# Patient Record
Sex: Male | Born: 1940 | Race: White | Hispanic: No | Marital: Married | Smoking: Former smoker
Health system: Southern US, Community
[De-identification: ages and names within clinical notes are randomized; demographics above are authoritative.]

## PROBLEM LIST (undated history)

## (undated) DIAGNOSIS — C801 Malignant (primary) neoplasm, unspecified: Secondary | ICD-10-CM

## (undated) DIAGNOSIS — I10 Essential (primary) hypertension: Secondary | ICD-10-CM

## (undated) DIAGNOSIS — E785 Hyperlipidemia, unspecified: Secondary | ICD-10-CM

## (undated) HISTORY — DX: Hyperlipidemia, unspecified: E78.5

## (undated) HISTORY — PX: SKIN CANCER EXCISION: SHX779

---

## 2006-04-14 ENCOUNTER — Ambulatory Visit: Payer: Self-pay | Admitting: Gastroenterology

## 2006-09-26 ENCOUNTER — Emergency Department: Payer: Self-pay | Admitting: General Practice

## 2006-10-10 ENCOUNTER — Emergency Department: Payer: Self-pay | Admitting: General Practice

## 2012-12-01 DIAGNOSIS — I10 Essential (primary) hypertension: Secondary | ICD-10-CM | POA: Insufficient documentation

## 2013-08-03 ENCOUNTER — Ambulatory Visit: Payer: Self-pay | Admitting: Emergency Medicine

## 2015-12-01 ENCOUNTER — Emergency Department
Admission: EM | Admit: 2015-12-01 | Discharge: 2015-12-01 | Disposition: A | Discharge status: Home / Self Care | Payer: Medicare Other | Attending: Student in an Organized Health Care Education/Training Program | Admitting: Student in an Organized Health Care Education/Training Program

## 2015-12-01 ENCOUNTER — Emergency Department: Payer: Medicare Other

## 2015-12-01 ENCOUNTER — Encounter: Payer: Self-pay | Admitting: Emergency Medicine

## 2015-12-01 DIAGNOSIS — M546 Pain in thoracic spine: Secondary | ICD-10-CM | POA: Diagnosis present

## 2015-12-01 DIAGNOSIS — Y999 Unspecified external cause status: Secondary | ICD-10-CM | POA: Diagnosis not present

## 2015-12-01 DIAGNOSIS — S161XXA Strain of muscle, fascia and tendon at neck level, initial encounter: Secondary | ICD-10-CM | POA: Diagnosis not present

## 2015-12-01 DIAGNOSIS — Y939 Activity, unspecified: Secondary | ICD-10-CM | POA: Insufficient documentation

## 2015-12-01 DIAGNOSIS — Y92410 Unspecified street and highway as the place of occurrence of the external cause: Secondary | ICD-10-CM | POA: Insufficient documentation

## 2015-12-01 DIAGNOSIS — I10 Essential (primary) hypertension: Secondary | ICD-10-CM | POA: Insufficient documentation

## 2015-12-01 DIAGNOSIS — R51 Headache: Secondary | ICD-10-CM | POA: Diagnosis not present

## 2015-12-01 DIAGNOSIS — M7918 Myalgia, other site: Secondary | ICD-10-CM

## 2015-12-01 HISTORY — DX: Essential (primary) hypertension: I10

## 2015-12-01 HISTORY — DX: Malignant (primary) neoplasm, unspecified: C80.1

## 2015-12-01 LAB — POCT RAPID STREP A: Streptococcus, Group A Screen (Direct): NEGATIVE

## 2015-12-01 MED ORDER — MELOXICAM 15 MG PO TABS: 15.0000 mg | ORAL_TABLET | Freq: Every day | ORAL | 0 refills | Status: DC

## 2015-12-01 MED ORDER — METHOCARBAMOL 500 MG PO TABS: 500.0000 mg | ORAL_TABLET | Freq: Four times a day (QID) | ORAL | 0 refills | Status: DC | PRN
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
## 2015-12-01 NOTE — ED Notes (Signed)
Discharge instructions reviewed with patient. Patient verbalized understanding. Patient ambulated to lobby without difficulty.   

## 2017-03-03 ENCOUNTER — Ambulatory Visit: Payer: Medicare Other | Admitting: Urology

## 2017-03-03 VITALS — BP 147/87 | HR 71 | Ht 73.0 in | Wt 283.4 lb

## 2017-03-03 DIAGNOSIS — N1339 Other hydronephrosis: Secondary | ICD-10-CM | POA: Diagnosis not present

## 2017-03-06 ENCOUNTER — Encounter: Payer: Self-pay | Admitting: Urology

## 2017-03-06 ENCOUNTER — Ambulatory Visit: Payer: Medicare Other | Admitting: Urology

## 2017-03-06 VITALS — BP 148/83 | HR 86 | Ht 73.0 in | Wt 282.5 lb

## 2017-03-06 DIAGNOSIS — N400 Enlarged prostate without lower urinary tract symptoms: Secondary | ICD-10-CM

## 2017-03-06 NOTE — Progress Notes (Signed)
03/06/2017 8:22 AM   Dickie La 15-Sep-1940 [id number]  Referring provider: Physicians, [facility] [address] [place], [place] [id number]  Chief Complaint  Patient presents with  . Urinary Retention    HPI: The patient is a 76 year old gentleman with past medical history of BPH on Flomax and finasteride presents today for trial of void after developing urinary retention.  These 2 medications were started three days ago.  He has significant baseline obstructive urinary symptoms.  He was seen by my partner 3 days ago for this reason for a trial void was not done due to being a late afternoon appointment.  He presents this morning today for a trial void.  He had a CT performed at [facility] which showed severely distended bladder and bilateral hydroureteronephrosis.  His hydronephrosis has resolved with placement of his catheter.  Apparently had large volume retention greater than 2 L.  Prostate was noted to be 168 cc on renal ultrasound.  Both CT and renal ultrasound are not available to me.   PMH: Past Medical History:  Diagnosis Date  . Cancer ([place])   . Hypertension     Surgical History: No past surgical history on file.  Home Medications:  Allergies as of 03/06/2017   Not on File     Medication List        Accurate as of 03/06/17  8:22 AM. Always use your most recent med list.          amLODipine 5 MG tablet Commonly known as:  NORVASC   atorvastatin 10 MG tablet Commonly known as:  LIPITOR   finasteride 5 MG tablet Commonly known as:  PROSCAR Take 5 mg by mouth.   tamsulosin 0.4 MG Caps capsule Commonly known as:  FLOMAX Take 0.4 mg by mouth.       Allergies: Not on File  Family History: No family history on file.  Social History:  has no tobacco, alcohol, and drug history on file.  ROS:                                        Physical Exam: BP (!) 148/83 (BP Location: Right Arm, Patient Position: Sitting, Cuff Size:  Large)   Pulse 86   Ht 6\' 1"  (1.854 m)   Wt 282 lb 8 oz (128.1 kg)   BMI 37.27 kg/m   Constitutional:  Alert and oriented, No acute distress. HEENT: [place] AT, moist mucus membranes.  Trachea midline, no masses. Cardiovascular: No clubbing, cyanosis, or edema. Respiratory: Normal respiratory effort, no increased work of breathing. GI: Abdomen is soft, nontender, nondistended, no abdominal masses GU: No CVA tenderness.  Skin: No rashes, bruises or suspicious lesions. Lymph: No cervical or inguinal adenopathy. Neurologic: Grossly intact, no focal deficits, moving all 4 extremities. Psychiatric: Normal mood and affect.  Laboratory Data: No results found for: WBC, HGB, HCT, MCV, PLT  No results found for: CREATININE  No results found for: PSA  No results found for: TESTOSTERONE  No results found for: HGBA1C  Urinalysis No results found for: COLORURINE, APPEARANCEUR, LABSPEC, PHURINE, GLUCOSEU, HGBUR, BILIRUBINUR, KETONESUR, PROTEINUR, UROBILINOGEN, NITRITE, LEUKOCYTESUR  Pertinent Imaging: CT and renal ultrasound reports reviewed.  Assessment & Plan:    1.  BPH with urinary retention We will have the patient undergo a trial of void today.  I did share with him that his prostate is 168 g and a  high volume urinary retention so it is unlikely he will pass this test.  For now we will keep him on his Flomax and finasteride.  If he fails, we can give him another trial of void in a week or 2.  If this is unsuccessful, refer him to my partner Dr. Erlene Quan to discuss HoLEP versus simple prostatectomy.   No Follow-up on file.  Nickie Retort, MD  [facility] [address], [place], [place] [address] [phone]

## 2017-03-06 NOTE — Progress Notes (Signed)
Fill and Pull Catheter Removal  Patient is present today for a catheter removal.  Patient was cleaned and prepped in a sterile fashion 347ml of sterile water/ saline was instilled into the bladder when the patient felt the urge to urinate. 72ml of water was then drained from the balloon.  A 16 coude foley cath was removed from the bladder no complications were noted .  Patient as then given some time to void on their own.  Patient can not void  on their own after some time.  Patient tolerated well.  Preformed by: Elberta Leatherwood, CMA  Follow up/ Additional notes:

## 2017-03-07 NOTE — Progress Notes (Signed)
Simple Catheter Placement  Due to urinary retention patient is present today for a foley cath placement.  Patient was cleaned and prepped in a sterile fashion with betadine and lidocaine jelly 2% was instilled into the urethra.  A 16FR coude foley catheter was inserted, urine return was noted  1010ml, urine was yellow in color.  The balloon was filled with 10cc of sterile water.  A leg bag was attached for drainage. Patient was also given a night bag to take home and was given instruction on how to change from one bag to another.  Patient was given instruction on proper catheter care.  Patient tolerated well.  Preformed by: Toniann Fail, LPN

## 2017-03-09 NOTE — Progress Notes (Signed)
03/03/2017 9:49 PM   Danny Douglas 24-Nov-1940 [id number]  Referring provider: Physicians, [facility] [address] [place], [place] [address]  Chief Complaint  Patient presents with  . Urinary Retention    HPI: Jewelz Kobus is a 76 year old male who presents today for evaluation of urinary retention.  He was seen at [facility] on 02/12/2017 with a 2-3-week history of urinary frequency, voiding small amounts, decreased force and caliber of his urinary stream and nocturia x4-5.  Denied fever, chills or gross hematuria.   Urinalysis was unremarkable.  He was treated with an empiric course of Septra DS for prostatitis.  He called back on 10/20 c/o persistent symptoms and dysuria and he was switched to Cipro. He called back on 10/22 complaining of persistent symptoms and decreased energy.  His creatinine was elevated in the 4 range.  A CT was performed which showed significant bladder distention and bilateral hydronephrosis/hydroureter.  He was advised to go to the emergency department and was seen there on 10/25.  A Foley catheter was placed with over 2 L of urine obtained.  He was discharged with an indwelling Foley catheter and started on tamsulosin and finasteride.  He has a catheter on 11/20 which was the earliest he can get at [facility] and presents.  Here today for further evaluation  PMH: Past Medical History:  Diagnosis Date  . Cancer ([place])   . Hyperlipidemia   . Hypertension     Surgical History: Past Surgical History:  Procedure Laterality Date  . SKIN CANCER EXCISION      Home Medications:  Allergies as of 03/03/2017   Not on File     Medication List        Accurate as of 03/03/17 11:59 PM. Always use your most recent med list.          amLODipine 5 MG tablet Commonly known as:  NORVASC   atorvastatin 10 MG tablet Commonly known as:  LIPITOR   finasteride 5 MG tablet Commonly known as:  PROSCAR Take 5 mg by mouth.   tamsulosin 0.4 MG Caps  capsule Commonly known as:  FLOMAX Take 0.4 mg by mouth.       Allergies: Not on File  Family History: Family History  Problem Relation Age of Onset  . Prostate cancer Neg Hx   . Bladder Cancer Neg Hx   . Kidney cancer Neg Hx     Social History:  reports that he has quit smoking. he has never used smokeless tobacco. He reports that he does not drink alcohol or use drugs.  ROS: UROLOGY Frequent Urination?: Yes Hard to postpone urination?: Yes Burning/pain with urination?: No Get up at night to urinate?: Yes Leakage of urine?: Yes Urine stream starts and stops?: No Trouble starting stream?: Yes Do you have to strain to urinate?: No Blood in urine?: Yes Urinary tract infection?: No Sexually transmitted disease?: No Injury to kidneys or bladder?: No Painful intercourse?: No Weak stream?: No Erection problems?: No Penile pain?: No  Gastrointestinal Nausea?: No Vomiting?: No Indigestion/heartburn?: No Diarrhea?: No Constipation?: No  Constitutional Fever: No Night sweats?: No Weight loss?: No Fatigue?: No  Skin Skin rash/lesions?: Yes Itching?: Yes  Eyes Blurred vision?: No Double vision?: No  Ears/Nose/Throat Sore throat?: No Sinus problems?: No  Hematologic/Lymphatic Swollen glands?: No Easy bruising?: No  Cardiovascular Leg swelling?: Yes Chest pain?: No  Respiratory Cough?: No Shortness of breath?: No  Endocrine Excessive thirst?: No  Musculoskeletal Back pain?: No Joint pain?:  No  Neurological Headaches?: No Dizziness?: No  Psychologic Depression?: No Anxiety?: No  Physical Exam: BP (!) 147/87 (BP Location: Right Arm, Patient Position: Sitting, Cuff Size: Large)   Pulse 71   Ht 6\' 1"  (1.854 m)   Wt 283 lb 6.4 oz (128.5 kg)   BMI 37.39 kg/m   Constitutional:  Alert and oriented, No acute distress. HEENT: [place] AT, moist mucus membranes.  Trachea midline, no masses. Cardiovascular: No clubbing, cyanosis, or  edema. Respiratory: Normal respiratory effort, no increased work of breathing. GI: Abdomen is soft, nontender, nondistended, no abdominal masses GU: No CVA tenderness. Prostate 60+ cc, smooth without nodules. Skin: No rashes, bruises or suspicious lesions. Lymph: No cervical or inguinal adenopathy. Neurologic: Grossly intact, no focal deficits, moving all 4 extremities. Psychiatric: Normal mood and affect.   Pertinent Imaging: CT was personally reviewed.   Assessment & Plan:    76 year old male with high volume urinary retention and bilateral hydronephrosis with acute renal failure.  He has been on tamsulosin approximately 10 days.  Due to the time of his appointment I did not recommend a voiding trial today and a follow-up renal ultrasound is scheduled.  He will return for an early morning appointment for review of U/S, catheter removal and trial of voiding.  - Ultrasound renal complete; Future    Abbie Sons, [place] [address] [place], [place] [address] [phone]

## 2017-03-25 ENCOUNTER — Ambulatory Visit: Payer: Medicare Other | Admitting: Urology

## 2019-04-30 DIAGNOSIS — G4733 Obstructive sleep apnea (adult) (pediatric): Secondary | ICD-10-CM | POA: Insufficient documentation

## 2019-05-17 DIAGNOSIS — E669 Obesity, unspecified: Secondary | ICD-10-CM | POA: Insufficient documentation

## 2019-06-30 DIAGNOSIS — G20C Parkinsonism, unspecified: Secondary | ICD-10-CM | POA: Insufficient documentation

## 2019-07-09 ENCOUNTER — Other Ambulatory Visit: Payer: Self-pay | Admitting: Neurology

## 2019-07-09 ENCOUNTER — Other Ambulatory Visit (HOSPITAL_COMMUNITY): Payer: Self-pay | Admitting: Neurology

## 2019-07-09 DIAGNOSIS — R2689 Other abnormalities of gait and mobility: Secondary | ICD-10-CM

## 2019-07-21 ENCOUNTER — Ambulatory Visit
Admission: RE | Admit: 2019-07-21 | Discharge: 2019-07-21 | Disposition: A | Discharge status: Home / Self Care | Payer: Medicare Other | Source: Ambulatory Visit | Attending: Neurology | Admitting: Neurology

## 2019-07-21 ENCOUNTER — Other Ambulatory Visit: Payer: Self-pay

## 2019-07-21 DIAGNOSIS — R2689 Other abnormalities of gait and mobility: Secondary | ICD-10-CM | POA: Diagnosis present

## 2021-09-10 IMAGING — MR MR HEAD W/O CM
12 series · 44 of 48 positions shown · non-contrast
Comparison: None.

CLINICAL DATA: Imbalance, left hand tremor

EXAM:
MRI HEAD WITHOUT CONTRAST
TECHNIQUE: Multiplanar, multiecho pulse sequences of the brain and surrounding
structures were obtained without intravenous contrast.

[Series 5: ax dwi_tracew · axial · 3.0mm · 0.60mm/px · z∈[-54,+97]mm · 4 of 48 slices shown]
[im 1/48]
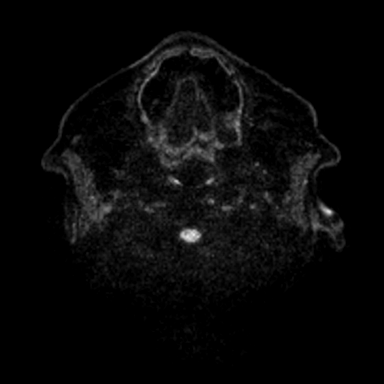
[im 16/48]
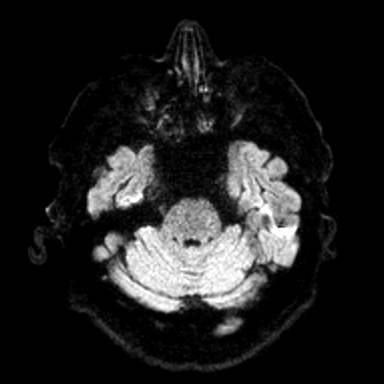
[im 32/48]
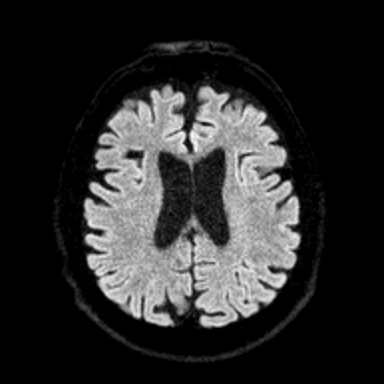
[im 48/48]
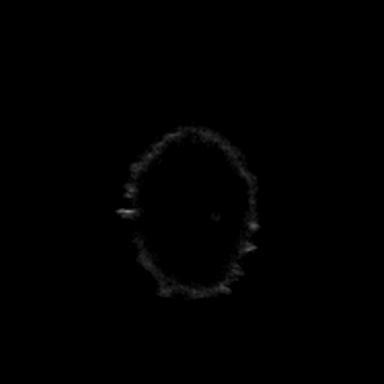

[Series 6: ax dwi_adc · axial · 3.0mm · 0.60mm/px · z∈[-54,+97]mm · 3 of 48 slices shown]
[im 1/48]
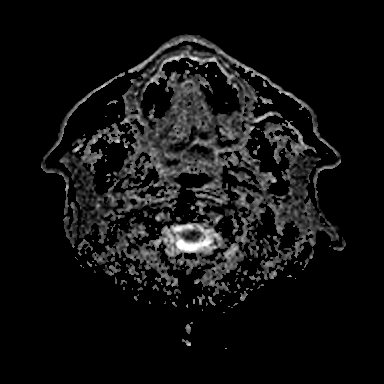
[im 24/48]
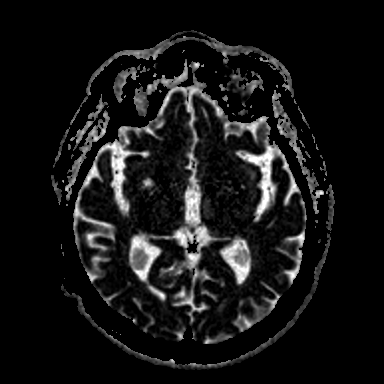
[im 48/48]
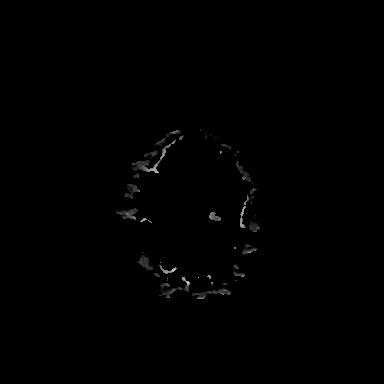

[Series 7: cor dwi_tracew · coronal · 5.0mm · 0.60mm/px · 5 of 68 slices shown]
[im 1/68]
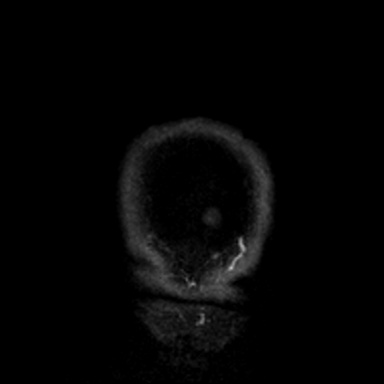
[im 17/68]
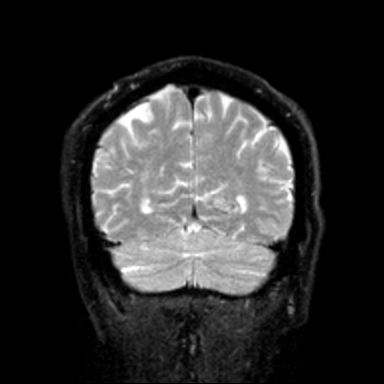
[im 34/68]
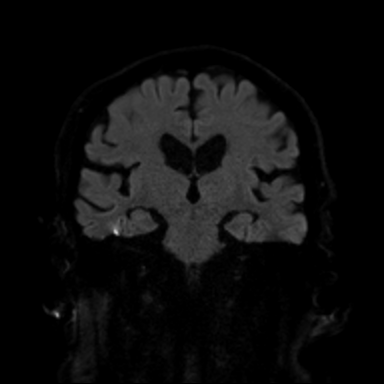
[im 51/68]
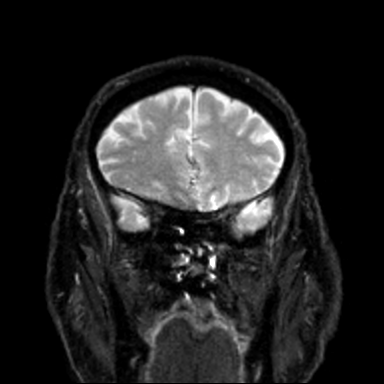
[im 68/68]
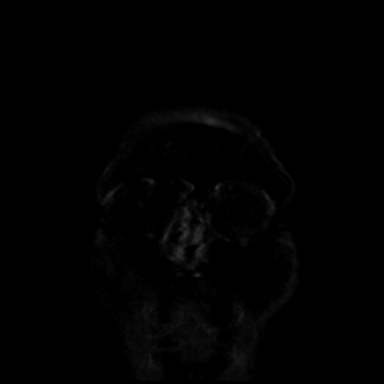

[Series 8: cor dwi_adc · coronal · 5.0mm · 0.60mm/px · 2 of 34 slices shown]
[im 1/34]
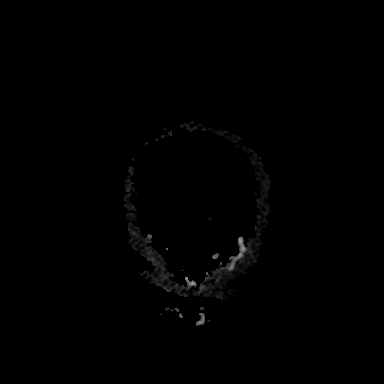
[im 34/34]
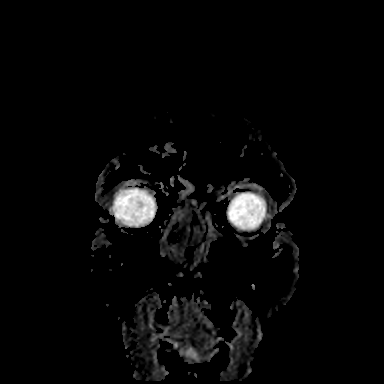

[Series 9: T1 · sagittal · 5.0mm · 0.62mm/px · 2 of 23 slices shown (1 of 2)]
[im 1/23]
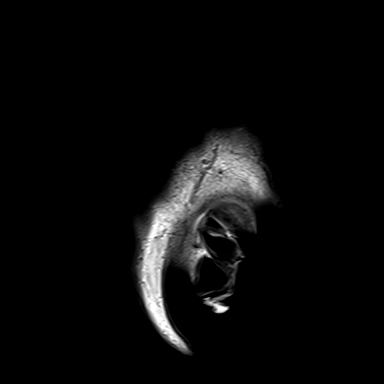
[im 23/23]
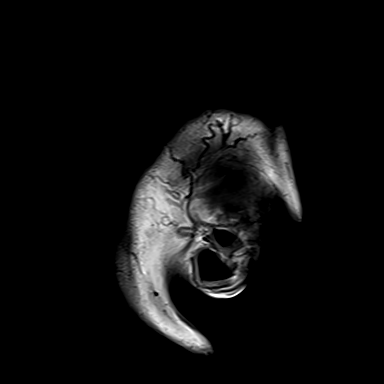

[Series 10: T2 · axial · 5.0mm · 0.53mm/px · z∈[-50,+90]mm · 2 of 25 slices shown (1 of 2)]
[im 1/25]
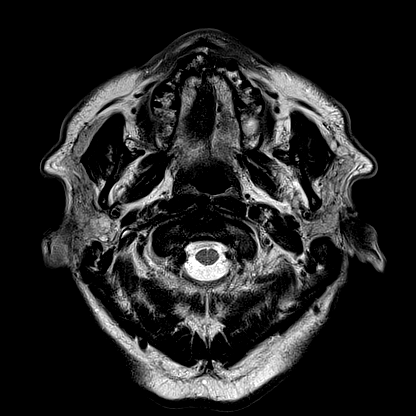
[im 25/25]
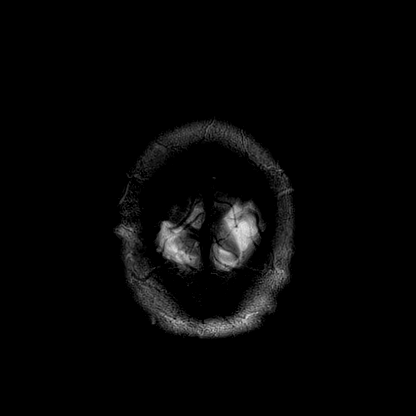

[Series 11: mag_images · axial · 3.0mm · 0.90mm/px · z∈[-65,+108]mm · 4 of 60 slices shown]
[im 1/60]
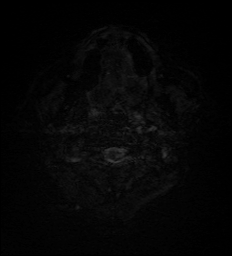
[im 20/60]
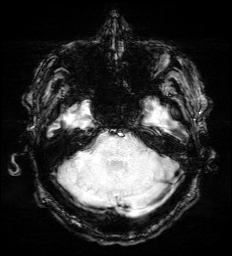
[im 40/60]
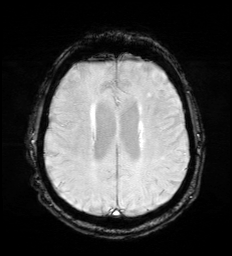
[im 60/60]
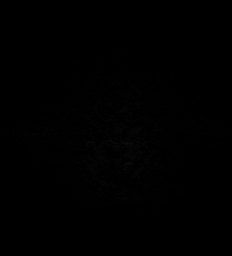

[Series 12: pha_images · axial · 3.0mm · 0.90mm/px · z∈[-65,+108]mm · 4 of 60 slices shown]
[im 1/60]
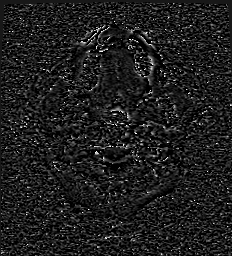
[im 20/60]
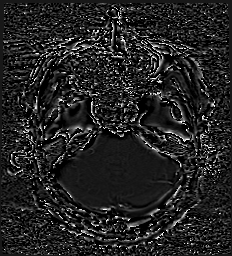
[im 40/60]
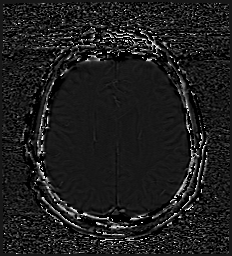
[im 60/60]
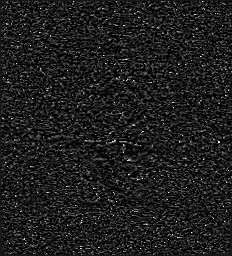

[Series 13: swi_images · axial · 3.0mm · 0.90mm/px · z∈[-65,+108]mm · 4 of 60 slices shown]
[im 1/60]
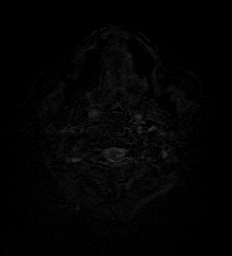
[im 20/60]
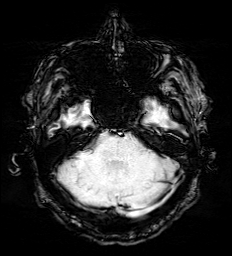
[im 40/60]
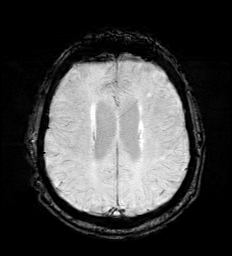
[im 60/60]
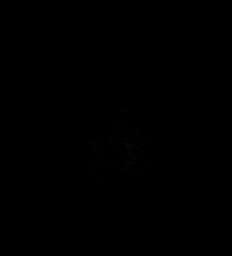

[Series 15: FLAIR · axial · 3.0mm · 0.53mm/px · z∈[-59,+99]mm · 4 of 55 slices shown]
[im 1/55]
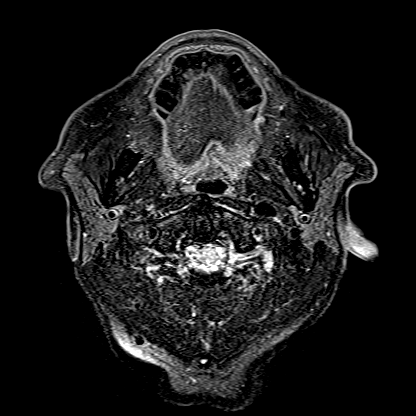
[im 19/55]
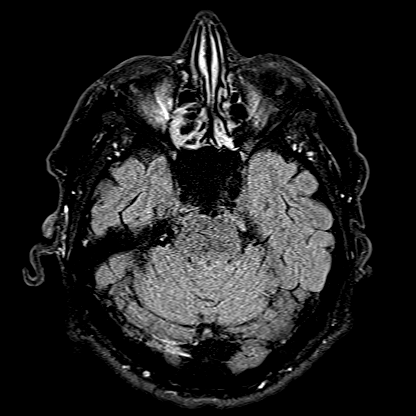
[im 37/55]
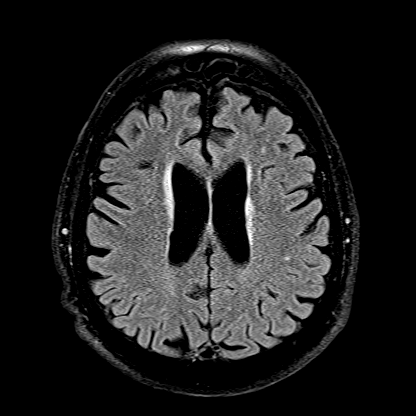
[im 55/55]
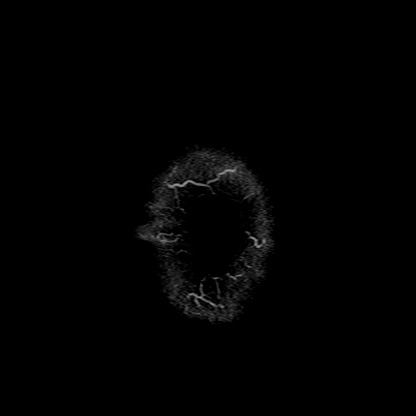

[Series 16: T1 · axial · 1.0mm · 0.98mm/px · z∈[-54,+101]mm · 8 of 160 slices shown (2 of 2)]
[im 1/160]
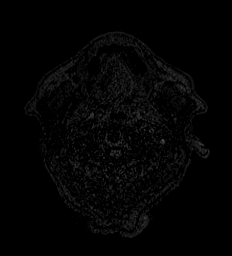
[im 29/160]
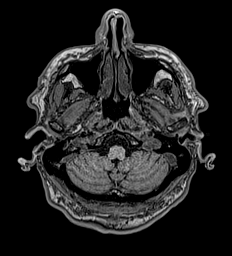
[im 44/160]
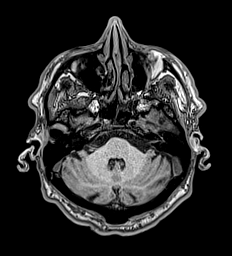
[im 73/160]
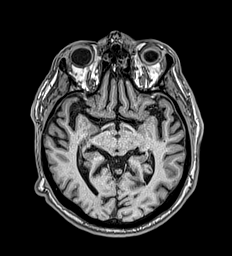
[im 87/160]
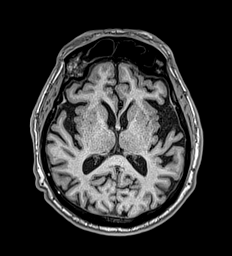
[im 116/160]
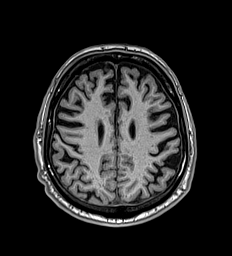
[im 131/160]
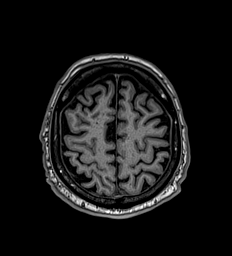
[im 160/160]
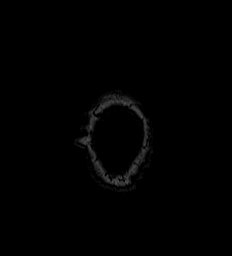

[Series 17: T2 · coronal · 5.0mm · 0.57mm/px · 2 of 26 slices shown (2 of 2)]
[im 1/26]
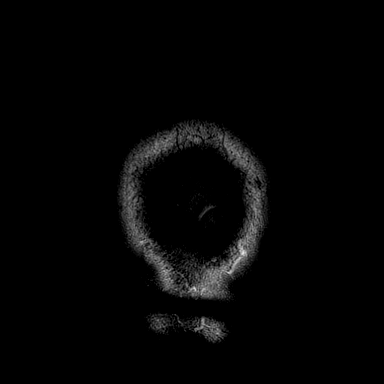
[im 26/26]
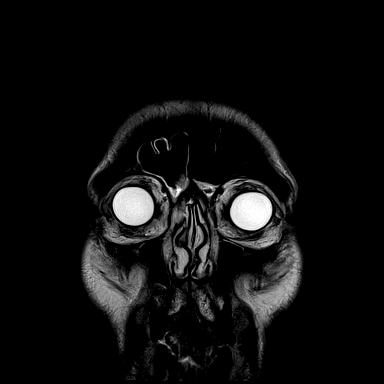

[44 of 48 positions shown; findings below may reference images not displayed]

FINDINGS: Brain: There is no acute infarction or intracranial hemorrhage.
There is no intracranial mass, mass effect, or edema. There is no
hydrocephalus or extra-axial fluid collection. Patchy foci of T2
hyperintensity in the supratentorial white matter are nonspecific
but may reflect mild chronic microvascular ischemic changes.
Prominence of the ventricles and sulci reflects mild generalized
parenchymal volume loss.

Vascular: Major vessel flow voids at the skull base are preserved.

Skull and upper cervical spine: Normal marrow signal is preserved.

Sinuses/Orbits: Patchy mucosal thickening.  Orbits are unremarkable.

Other: Sella is unremarkable.  Mastoid air cells are clear.
IMPRESSION: No evidence of recent infarction, hemorrhage, or mass. Mild chronic
microvascular ischemic changes.

## 2022-11-25 DIAGNOSIS — I77810 Thoracic aortic ectasia: Secondary | ICD-10-CM | POA: Insufficient documentation

## 2023-06-25 ENCOUNTER — Ambulatory Visit (INDEPENDENT_AMBULATORY_CARE_PROVIDER_SITE_OTHER): Payer: Self-pay | Admitting: Internal Medicine

## 2023-06-25 VITALS — BP 173/99 | HR 81 | Resp 16 | Ht 72.0 in | Wt 254.0 lb

## 2023-06-25 DIAGNOSIS — Z7189 Other specified counseling: Secondary | ICD-10-CM | POA: Diagnosis not present

## 2023-06-25 DIAGNOSIS — E669 Obesity, unspecified: Secondary | ICD-10-CM

## 2023-06-25 DIAGNOSIS — G4733 Obstructive sleep apnea (adult) (pediatric): Secondary | ICD-10-CM

## 2023-06-25 DIAGNOSIS — I10 Essential (primary) hypertension: Secondary | ICD-10-CM

## 2023-06-25 NOTE — Progress Notes (Unsigned)
 [facility] [address] [place], [place] [address]  Pulmonary Sleep Medicine   Office Visit Note  Patient Name: Danny Douglas DOB: [birth year] MRN [id number]    Chief Complaint: Obstructive Sleep Apnea visit  Brief History:  Danny Douglas presents for an initial consult for sleep evaluation and to establish care. The patient has a longstanding history of sleep apnea and is currently on a CPAP. Prior to using a PAP, sleep quality was poor. This was noted most nights. The patient reported the following symptoms:  snoring, witnessed apneic pauses and fatigue. The Epworth Sleepiness Score is 5 out of 24 . The patient relates  Cardiovascular risk factors include: HTN. The patient is currently on a CPAP@ 10 cmH2O. However, the patient's machine is currently 82 years old, reached end of useful life and obsolete for repair. In order to assure reliable treatment for OSA, patient's machine should be replaced. The patient reports using his PAP and feels rested after sleeping with PAP.  The patient reports benefiting from PAP use and would like for him to continue using PAP. Reported sleepiness is improved. The compliance download shows 96% compliance with an average use time of 6 hours 33  minutes. The AHI is 0.1.  The patient continues to require PAP therapy as a medical necessity in order to eliminate his sleep apnea. Patient recently had updated Split study with [facility] 2 weeks ago confirming continued OSA with need for treatment with CPAP.  ROS  General: (-) fever, (-) chills, (-) night sweat Nose and Sinuses: (-) nasal stuffiness or itchiness, (-) postnasal drip, (-) nosebleeds, (-) sinus trouble. Mouth and Throat: (-) sore throat, (-) hoarseness. Neck: (-) swollen glands, (-) enlarged thyroid, (-) neck pain. Respiratory: + cough, - shortness of breath, - wheezing. Neurologic: - numbness, - tingling. Psychiatric: - anxiety, - depression   Current Medication: Outpatient Encounter  Medications as of 06/25/2023  Medication Sig   traZODone (DESYREL) 100 MG tablet TAKE 1 TO 2 TABLETS BY MOUTH AT BEDTIME AS NEEDED OR AS DIRECTED   ezetimibe (ZETIA) 10 MG tablet Take 10 mg by mouth daily.   [DISCONTINUED] amLODipine (NORVASC) 5 MG tablet    [DISCONTINUED] atorvastatin (LIPITOR) 10 MG tablet    No facility-administered encounter medications on file as of 06/25/2023.    Surgical History: Past Surgical History:  Procedure Laterality Date   SKIN CANCER EXCISION      Medical History: Past Medical History:  Diagnosis Date   Cancer (HCC)    Hyperlipidemia    Hypertension     Family History: Non contributory to the present illness  Social History: Social History   Socioeconomic History   Marital status: Married    Spouse name: Not on file   Number of children: Not on file   Years of education: Not on file   Highest education level: Not on file  Occupational History   Not on file  Tobacco Use   Smoking status: Former   Smokeless tobacco: Never  Vaping Use   Vaping status: Never Used  Substance and Sexual Activity   Alcohol use: No   Drug use: No   Sexual activity: Not on file  Other Topics Concern   Not on file  Social History Narrative   Not on file   Social Drivers of Health   Financial Resource Strain: Low Risk  (08/20/2022)   Received from [facility]   Overall Financial Resource Strain (CARDIA)    Difficulty of Paying Living Expenses: Not very  hard  Food Insecurity: No Food Insecurity (08/20/2022)   Received from [facility]   Hunger Vital Sign    Worried About Running Out of Food in the Last Year: Never true    Ran Out of Food in the Last Year: Never true  Transportation Needs: No Transportation Needs (08/20/2022)   Received from [facility] - Transportation    Lack of Transportation (Medical): No    Lack of Transportation (Non-Medical): No  Physical Activity: Sufficiently Active (02/22/2021)   Received from [facility]   Exercise Vital Sign    Days of Exercise per Week: 7 days    Minutes of Exercise per Session: 50 min  Stress: No Stress Concern Present (02/22/2021)   Received from [facility] of [facility]ational [facility] - Occupational Stress Questionnaire    Feeling of Stress : Not at all  Social Connections: Unknown (02/22/2021)   Received from [facility]   Social Connection and Isolation Panel [NHANES]    Frequency of Communication with Friends and Family: More than three times a week    Frequency of Social Gatherings with Friends and Family: Twice a week    Attends Religious Services: Patient declined    Database administrator or Organizations: Patient declined    Attends Banker Meetings: Patient declined    Marital Status: Married  Catering manager Violence: Not At Risk (02/22/2021)   Received from [facility]   Humiliation, Afraid, Rape, and Kick questionnaire    Fear of Current or Ex-Partner: No    Emotionally Abused: No    Physically Abused: No    Sexually Abused: No    Vital Signs: Blood pressure (!) 173/99, pulse 81, resp. rate 16, height 6' (1.829 m), weight 254 lb (115.2 kg), SpO2 98%. Body mass index is 34.45 kg/m.    Examination: General Appearance: The patient is well-developed, well-nourished, and in no distress. Neck Circumference: 47 cm Skin: Gross inspection of skin unremarkable. Head: normocephalic, no gross deformities. Eyes: no gross deformities noted. ENT: ears appear grossly normal Neurologic: Alert and oriented. No involuntary movements.  STOP BANG RISK ASSESSMENT S (snore) Have you been told that you snore?     NO   T (tired) Are you often tired, fatigued, or sleepy during the day?   NO  O (obstruction) Do you stop breathing, choke, or gasp during sleep? NO   P (pressure) Do you have or are you being treated for high blood pressure? YES   B (BMI) Is your body index greater than 35 kg/m? YES   A (age)  Are you 56 years old or older? YES   N (neck) Do you have a neck circumference greater than 16 inches?   YES   G (gender) Are you a male? YES   TOTAL STOP/BANG "YES" ANSWERS 5       A STOP-Bang score of 2 or less is considered low risk, and a score of 5 or more is high risk for having either moderate or severe OSA. For people who score 3 or 4, doctors may need to perform further assessment to determine how likely they are to have OSA.         EPWORTH SLEEPINESS SCALE:  Scale:  (0)= no chance of dozing; (1)= slight chance of dozing; (2)= moderate chance of dozing; (3)= high chance of dozing  Chance  Situtation    Sitting and reading: 1  Watching TV: 1    Sitting Inactive in public: 0    As a passenger in car: 1      Lying down to rest: 2    Sitting and talking: 0    Sitting quielty after lunch: 0    In a car, stopped in traffic: 0   TOTAL SCORE:   5 out of 24    SLEEP STUDIES:  Split Study (06/09/2023 at [facility]) AHI 31.2/hr, REM AHI 40/hr, min SpO2 83%, CPAP@ 9 cmH2O   CPAP COMPLIANCE DATA:  Date Range: 06/24/2021-06/23/2022 (last data on unit)  Average Daily Use: 6 hours 33 minute  Median Use: 6 hours 33 minute  Compliance for > 4 Hours: 96%  AHI: 0.1 respiratory events per hour  Days Used: 360/365 days  Mask Leak: 13   95th Percentile Pressure: 10         LABS: No results found for this or any previous visit (from the past 2160 hours).  Radiology: MR BRAIN WO CONTRAST Result Date: 07/22/2019 CLINICAL DATA:  Imbalance, left hand tremor EXAM: MRI HEAD WITHOUT CONTRAST TECHNIQUE: Multiplanar, multiecho pulse sequences of the brain and surrounding structures were obtained without intravenous contrast. COMPARISON:  None. FINDINGS: Brain: There is no acute infarction or intracranial hemorrhage. There is no intracranial mass, mass effect, or edema. There is no hydrocephalus or extra-axial fluid collection. Patchy foci of T2 hyperintensity in the  supratentorial white matter are nonspecific but may reflect mild chronic microvascular ischemic changes. Prominence of the ventricles and sulci reflects mild generalized parenchymal volume loss. Vascular: Major vessel flow voids at the skull base are preserved. Skull and upper cervical spine: Normal marrow signal is preserved. Sinuses/Orbits: Patchy mucosal thickening.  Orbits are unremarkable. Other: Sella is unremarkable.  Mastoid air cells are clear. IMPRESSION: No evidence of recent infarction, hemorrhage, or mass. Mild chronic microvascular ischemic changes. Electronically Signed   By: Guadlupe Spanish M.D.   On: 07/22/2019 08:16    No results found.  No results found.    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 The patient was reminded how to adjust mask fit and advised to change supplies regularly. The patient was also counselled on nightly use. The compliance is excellent. The AHI is 0.1. Patient continues to require PAP to treat their apnea and is medically necessary. The patient's machine is past end of life and must be replaced. Discussed we will likely need to obtain F2F notes from prior to testing and signed copy of recent sleep study in order to move forward with replacement.   1. Obstructive sleep apnea (Primary) continue excellent compliance. Follow up 30+ days after set up  2. CPAP use counseling CPAP couseling-Discussed importance of adequate CPAP use as well as proper care and cleaning techniques of machine and all supplies.  3. Essential (primary) hypertension Reports recently stopping losartan due to cough and is not on any other meds. BP elevated in office, but  not symptomatic and will recheck at home. Will need to follow up with PCP  4. Obesity (BMI 30-39.9) Obesity Counseling: Had a lengthy discussion regarding patients BMI and weight issues. Patient was instructed on portion control as well as increased activity. Also  discussed caloric restrictions with trying to maintain intake less than 2000 Kcal. Discussions were made in accordance with the 5As of weight management. Simple actions such as not eating late and if able to, taking a walk is suggested.    General Counseling: I have discussed the findings of the evaluation and examination with Danny Douglas.  I have also discussed any further diagnostic evaluation thatmay be needed or ordered today. Danny Douglas verbalizes understanding of the findings of todays visit. We also reviewed his medications today and discussed drug interactions and side effects including but not limited excessive drowsiness and altered mental states. We also discussed that there is always a risk not just to him but also people around him. he has been encouraged to call the office with any questions or concerns that should arise related to todays visit.  No orders of the defined types were placed in this encounter.       I have personally obtained a history, examined the patient, evaluated laboratory and imaging results, formulated the assessment and plan and placed orders.  This patient was seen by Lynn Ito, PA-C in collaboration with Dr. Freda Munro as a part of collaborative care agreement.  Yevonne Pax, MD [facility] Diplomate ABMS Pulmonary Critical Care Medicine and Sleep Medicine

## 2023-06-25 NOTE — Patient Instructions (Signed)

## 2024-05-18 NOTE — Progress Notes (Signed)
 [facility] [address] [place], [place] [address]  Pulmonary Sleep Medicine   Office Visit Note  Patient Name: Danny Douglas DOB: 25-Jun-1940 MRN [id number]    Chief Complaint: Obstructive Sleep Apnea visit  Brief History:  Jeremi is seen today for a restart on CPAP@ 9 cmH20. The patient has a 10+ year history of sleep apnea. Prior to PAP had snoring and fatigue. Patient is using PAP nightly.  The patient feels rested after sleeping with PAP.  The patient reports benefit from PAP use. Reported sleepiness is  improved and the Epworth Sleepiness Score is 3 out of 24. The patient  takes 1-2 naps per week that can last an hour without PAP.  Encouraged PAP use for any planned naps.  The patient complains of the following: none at this time, but would like to try nasal pillows mask instead.  The compliance download shows 97% compliance with an average use time of 6 hours 57 minutes. The AHI is 0.2  The patient does not complain of limb movements disrupting sleep. Patient's machine is past end of life and is due to be replaced.   ROS  General: (-) fever, (-) chills, (-) night sweat Nose and Sinuses: (-) nasal stuffiness or itchiness, (-) postnasal drip, (-) nosebleeds, (-) sinus trouble. Mouth and Throat: (-) sore throat, (-) hoarseness. Neck: (-) swollen glands, (-) enlarged thyroid, (-) neck pain. Respiratory: + cough, - shortness of breath, - wheezing. Neurologic: - numbness, - tingling. Psychiatric: - anxiety, - depression   Current Medication: Outpatient Encounter Medications as of 05/19/2024  Medication Sig   donepezil (ARICEPT) 10 MG tablet Take 10 mg by mouth.   ezetimibe (ZETIA) 10 MG tablet Take 10 mg by mouth daily.   traZODone (DESYREL) 100 MG tablet TAKE 1 TO 2 TABLETS BY MOUTH AT BEDTIME AS NEEDED OR AS DIRECTED   No facility-administered encounter medications on file as of 05/19/2024.    Surgical History: Past Surgical History:  Procedure Laterality Date    SKIN CANCER EXCISION      Medical History: Past Medical History:  Diagnosis Date   Cancer (HCC)    Hyperlipidemia    Hypertension     Family History: Non contributory to the present illness  Social History: Social History   Socioeconomic History   Marital status: Married    Spouse name: Not on file   Number of children: Not on file   Years of education: Not on file   Highest education level: Not on file  Occupational History   Not on file  Tobacco Use   Smoking status: Former   Smokeless tobacco: Never  Vaping Use   Vaping status: Never Used  Substance and Sexual Activity   Alcohol use: No   Drug use: No   Sexual activity: Not on file  Other Topics Concern   Not on file  Social History Narrative   Not on file   Social Drivers of Health   Tobacco Use: Medium Risk (05/19/2024)   Patient History    Smoking Tobacco Use: Former    Smokeless Tobacco Use: Never    Passive Exposure: Not on file  Financial Resource Strain: Low Risk (09/30/2023)   Received from [facility]   Overall Financial Resource Strain (CARDIA)    Difficulty of Paying Living Expenses: Not hard at all  Food Insecurity: No Food Insecurity (09/30/2023)   Received from [facility]   Epic    Within the past 12 months, you worried  that your food would run out before you got the money to buy more.: Never true    Within the past 12 months, the food you bought just didn't last and you didn't have money to get more.: Never true  Transportation Needs: No Transportation Needs (09/30/2023)   Received from [facility] - Transportation    Lack of Transportation (Medical): No    Lack of Transportation (Non-Medical): No  Physical Activity: Sufficiently Active (09/30/2023)   Received from [facility]   Exercise Vital Sign    On average, how many days per week do you engage in moderate to strenuous exercise (like a brisk walk)?: 7 days    On average, how many minutes do you engage in  exercise at this level?: 60 min  Stress: No Stress Concern Present (09/30/2023)   Received from [facility] Occupational Health - Occupational Stress Questionnaire    Feeling of Stress : Not at all  Social Connections: Moderately Isolated (09/30/2023)   Received from [facility]   Social Connection and Isolation Panel    In a typical week, how many times do you talk on the phone with family, friends, or neighbors?: Three times a week    How often do you get together with friends or relatives?: Twice a week    How often do you attend church or religious services?: Never    Do you belong to any clubs or organizations such as church groups, unions, fraternal or athletic groups, or school groups?: No    How often do you attend meetings of the clubs or organizations you belong to?: Never    Are you married, widowed, divorced, separated, never married, or living with a partner?: Married  Intimate Partner Violence: Not At Risk (09/30/2023)   Received from [facility]   Epic    Within the last year, have you been afraid of your partner or ex-partner?: No    Within the last year, have you been humiliated or emotionally abused in other ways by your partner or ex-partner?: No    Within the last year, have you been kicked, hit, slapped, or otherwise physically hurt by your partner or ex-partner?: No    Within the last year, have you been raped or forced to have any kind of sexual activity by your partner or ex-partner?: No  Depression (PHQ2-9): Not on file  Alcohol Screen: Not on file  Housing: Not on file  Utilities: Low Risk (09/30/2023)   Received from [facility]   Utilities    Within the past 12 months, have you been unable to get utilities(heat, electricity) when it was really needed?: No  Health Literacy: Low Risk (09/30/2023)   Received from [facility] Literacy    How often do you need to have someone help you when you read instructions,  pamphlets, or other written material from your doctor or pharmacy?: Never    Vital Signs: Blood pressure (!) 151/85, pulse (!) 58, resp. rate 20, height 6' (1.829 m), weight 236 lb 3.2 oz (107.1 kg), SpO2 97%. Body mass index is 32.03 kg/m.    Examination: General Appearance: The patient is well-developed, well-nourished, and in no distress. Neck Circumference: 44cm Skin: Gross inspection of skin unremarkable. Head: normocephalic, no gross deformities. Eyes: no gross deformities noted. ENT: ears appear grossly normal Neurologic: Alert and oriented. No involuntary movements.  STOP BANG RISK ASSESSMENT S (snore) Have  you been told that you snore?     NO   T (tired) Are you often tired, fatigued, or sleepy during the day?   NO  O (obstruction) Do you stop breathing, choke, or gasp during sleep? NO   P (pressure) Do you have or are you being treated for high blood pressure? NO   B (BMI) Is your body index greater than 35 kg/m? NO   A (age) Are you 58 years old or older? YES   N (neck) Do you have a neck circumference greater than 16 inches?   YES   G (gender) Are you a male? YES   TOTAL STOP/BANG YES ANSWERS 3       A STOP-Bang score of 2 or less is considered low risk, and a score of 5 or more is high risk for having either moderate or severe OSA. For people who score 3 or 4, doctors may need to perform further assessment to determine how likely they are to have OSA.         EPWORTH SLEEPINESS SCALE:  Scale:  (0)= no chance of dozing; (1)= slight chance of dozing; (2)= moderate chance of dozing; (3)= high chance of dozing  Chance  Situtation    Sitting and reading: 0    Watching TV: 1    Sitting Inactive in public: 0    As a passenger in car: 0      Lying down to rest: 2    Sitting and talking: 0    Sitting quielty after lunch: 0    In a car, stopped in traffic: 0   TOTAL SCORE:   3 out of 24    SLEEP STUDIES:  Titration (12/15/2013)  Titration -  CPAP @ 10 cmH2O Split Study (06/09/2023 at [facility]) AHI 31.2/hr, REM AHI 40/hr, min Sp02 83%, CPAP @ 9cmH2O   CPAP COMPLIANCE DATA:  Date Range: 02/20/2024 - 05/19/2024  Average Daily Use: 7 hours 12 minutes  Median Use: 7 hours 20 minutes  Compliance for > 4 Hours: 97% days  AHI: 0.2 respiratory events per hour  Days Used: 87/90  Mask Leak: 32.8  95th Percentile Pressure: 9cmH20         LABS: No results found for this or any previous visit (from the past 2160 hours).  Radiology: MR BRAIN WO CONTRAST Result Date: 07/22/2019 CLINICAL DATA:  Imbalance, left hand tremor EXAM: MRI HEAD WITHOUT CONTRAST TECHNIQUE: Multiplanar, multiecho pulse sequences of the brain and surrounding structures were obtained without intravenous contrast. COMPARISON:  None. FINDINGS: Brain: There is no acute infarction or intracranial hemorrhage. There is no intracranial mass, mass effect, or edema. There is no hydrocephalus or extra-axial fluid collection. Patchy foci of T2 hyperintensity in the supratentorial white matter are nonspecific but may reflect mild chronic microvascular ischemic changes. Prominence of the ventricles and sulci reflects mild generalized parenchymal volume loss. Vascular: Major vessel flow voids at the skull base are preserved. Skull and upper cervical spine: Normal marrow signal is preserved. Sinuses/Orbits: Patchy mucosal thickening.  Orbits are unremarkable. Other: Sella is unremarkable.  Mastoid air cells are clear. IMPRESSION: No evidence of recent infarction, hemorrhage, or mass. Mild chronic microvascular ischemic changes. Electronically Signed   By: Santina Blanch M.D.   On: 07/22/2019 08:16    No results found.  No results found.    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 The patient was reminded how to adjust mask fit and advised to nightly use. The patient was also counselled on nightly use. The compliance is excellent. The AHI is 0.2. The patient's machine is past end of life and must be replaced. Due to insurance requirements patient will have updated testing prior to replacement setup and prefers HST.    1. Obstructive sleep apnea (Primary) continue excellent compliance. Follow up 30+ days after set up pending HST  2. CPAP use counseling CPAP couseling-Discussed importance of adequate CPAP use as well as proper care and cleaning techniques of machine and all supplies.  3. Essential (primary) hypertension Continue current medication and f/u with PCP.  4. Obesity (BMI 30-39.9) Obesity Counseling: Had a lengthy discussion regarding patients BMI and weight issues. Patient was instructed on portion control as well as increased activity. Also discussed caloric restrictions with trying to maintain intake less than 2000 Kcal. Discussions were made in accordance with the 5As of weight management. Simple actions such as not eating late and if able to, taking a walk is suggested.    General Counseling: I have discussed the findings of the evaluation and examination with Prentice.  I have also discussed any further diagnostic evaluation thatmay be needed or ordered today. Niklaus verbalizes understanding of the findings of todays visit. We also reviewed his medications today and discussed drug interactions and side effects including but not limited excessive drowsiness and altered mental states. We also discussed that there is always a risk not just to him but also people around him. he has been encouraged to call the office with any questions or concerns that should arise related to todays visit.  No orders of the defined types were placed in this encounter.        I have personally obtained a history, examined the patient, evaluated laboratory and imaging results, formulated the assessment and plan and placed orders.  This patient was seen by Tinnie Pro, PA-C in collaboration with Dr. Elfreda Bathe as a part of collaborative care agreement.  Elfreda DELENA Bathe, MD [facility] Diplomate ABMS Pulmonary Critical Care Medicine and Sleep Medicine

## 2024-05-19 ENCOUNTER — Ambulatory Visit (INDEPENDENT_AMBULATORY_CARE_PROVIDER_SITE_OTHER): Admitting: Internal Medicine

## 2024-05-19 VITALS — BP 151/85 | HR 58 | Resp 20 | Ht 72.0 in | Wt 236.2 lb

## 2024-05-19 DIAGNOSIS — G4733 Obstructive sleep apnea (adult) (pediatric): Secondary | ICD-10-CM

## 2024-05-19 DIAGNOSIS — E669 Obesity, unspecified: Secondary | ICD-10-CM | POA: Diagnosis not present

## 2024-05-19 DIAGNOSIS — I10 Essential (primary) hypertension: Secondary | ICD-10-CM | POA: Diagnosis not present

## 2024-05-19 DIAGNOSIS — Z7189 Other specified counseling: Secondary | ICD-10-CM

## 2024-05-19 NOTE — Patient Instructions (Signed)
